# Patient Record
Sex: Female | Born: 1985 | Race: White | Hispanic: No | Marital: Married | Smoking: Never smoker
Health system: Southern US, Community
[De-identification: ages and names within clinical notes are randomized; demographics above are authoritative.]

## PROBLEM LIST (undated history)

## (undated) DIAGNOSIS — N390 Urinary tract infection, site not specified: Secondary | ICD-10-CM

## (undated) DIAGNOSIS — J45909 Unspecified asthma, uncomplicated: Secondary | ICD-10-CM

## (undated) DIAGNOSIS — Z8619 Personal history of other infectious and parasitic diseases: Secondary | ICD-10-CM

## (undated) DIAGNOSIS — L509 Urticaria, unspecified: Secondary | ICD-10-CM

## (undated) DIAGNOSIS — T7840XA Allergy, unspecified, initial encounter: Secondary | ICD-10-CM

## (undated) HISTORY — DX: Allergy, unspecified, initial encounter: T78.40XA

## (undated) HISTORY — DX: Urinary tract infection, site not specified: N39.0

## (undated) HISTORY — DX: Personal history of other infectious and parasitic diseases: Z86.19

## (undated) HISTORY — PX: TONSILLECTOMY: SUR1361

---
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
## 2007-01-17 ENCOUNTER — Emergency Department: Payer: Self-pay | Admitting: Emergency Medicine

## 2009-08-21 ENCOUNTER — Encounter: Admission: RE | Admit: 2009-08-21 | Discharge: 2009-08-21 | Payer: Self-pay | Admitting: Family Medicine

## 2009-08-29 ENCOUNTER — Encounter: Admission: RE | Admit: 2009-08-29 | Discharge: 2009-08-29 | Payer: Self-pay | Admitting: Family Medicine

## 2010-05-21 ENCOUNTER — Encounter: Admission: RE | Admit: 2010-05-21 | Discharge: 2010-05-21 | Payer: Self-pay | Admitting: Pediatrics

## 2010-06-02 ENCOUNTER — Emergency Department (HOSPITAL_COMMUNITY): Admission: EM | Admit: 2010-06-02 | Discharge: 2010-06-02 | Payer: Self-pay | Admitting: Family Medicine

## 2011-03-08 ENCOUNTER — Inpatient Hospital Stay (INDEPENDENT_AMBULATORY_CARE_PROVIDER_SITE_OTHER)
Admission: RE | Admit: 2011-03-08 | Discharge: 2011-03-08 | Disposition: A | Discharge status: Home / Self Care | Payer: BC Managed Care – PPO | Source: Ambulatory Visit | Attending: Family Medicine | Admitting: Family Medicine

## 2011-03-08 DIAGNOSIS — R1013 Epigastric pain: Secondary | ICD-10-CM

## 2011-03-08 LAB — POCT URINALYSIS DIP (DEVICE)
Bilirubin Urine: NEGATIVE
Glucose, UA: NEGATIVE mg/dL
Hgb urine dipstick: NEGATIVE
Ketones, ur: NEGATIVE mg/dL
Specific Gravity, Urine: 1.015 (ref 1.005–1.030)
Urobilinogen, UA: 0.2 mg/dL (ref 0.0–1.0)

## 2011-03-08 LAB — POCT PREGNANCY, URINE: Preg Test, Ur: NEGATIVE

## 2011-03-08 LAB — POCT H PYLORI SCREEN: H. PYLORI SCREEN, POC: NEGATIVE

## 2011-05-05 ENCOUNTER — Ambulatory Visit (HOSPITAL_COMMUNITY)
Admission: RE | Admit: 2011-05-05 | Discharge: 2011-05-05 | Disposition: A | Discharge status: Home / Self Care | Payer: BC Managed Care – PPO | Source: Ambulatory Visit | Attending: Pediatrics | Admitting: Pediatrics

## 2011-05-05 ENCOUNTER — Other Ambulatory Visit (HOSPITAL_COMMUNITY): Payer: Self-pay | Admitting: Pediatrics

## 2011-05-05 DIAGNOSIS — R1032 Left lower quadrant pain: Secondary | ICD-10-CM | POA: Insufficient documentation

## 2013-09-28 DIAGNOSIS — D242 Benign neoplasm of left breast: Secondary | ICD-10-CM | POA: Insufficient documentation

## 2013-09-28 DIAGNOSIS — N946 Dysmenorrhea, unspecified: Secondary | ICD-10-CM | POA: Insufficient documentation

## 2016-09-11 DIAGNOSIS — J324 Chronic pansinusitis: Secondary | ICD-10-CM | POA: Insufficient documentation

## 2018-07-06 ENCOUNTER — Other Ambulatory Visit: Payer: Self-pay

## 2018-07-06 ENCOUNTER — Emergency Department (HOSPITAL_COMMUNITY)
Admission: EM | Admit: 2018-07-06 | Discharge: 2018-07-06 | Disposition: A | Discharge status: Home / Self Care | Payer: 59 | Attending: Emergency Medicine | Admitting: Emergency Medicine

## 2018-07-06 ENCOUNTER — Encounter (HOSPITAL_COMMUNITY): Payer: Self-pay | Admitting: Emergency Medicine

## 2018-07-06 DIAGNOSIS — B349 Viral infection, unspecified: Secondary | ICD-10-CM | POA: Diagnosis not present

## 2018-07-06 DIAGNOSIS — L509 Urticaria, unspecified: Secondary | ICD-10-CM

## 2018-07-06 HISTORY — DX: Urticaria, unspecified: L50.9

## 2018-07-06 LAB — CBC WITH DIFFERENTIAL/PLATELET
Abs Immature Granulocytes: 0.02 10*3/uL (ref 0.00–0.07)
BASOS ABS: 0 10*3/uL (ref 0.0–0.1)
Basophils Relative: 0 %
Eosinophils Absolute: 0 10*3/uL (ref 0.0–0.5)
Eosinophils Relative: 0 %
HEMATOCRIT: 39.1 % (ref 36.0–46.0)
HEMOGLOBIN: 13 g/dL (ref 12.0–15.0)
Immature Granulocytes: 0 %
LYMPHS ABS: 0.7 10*3/uL (ref 0.7–4.0)
LYMPHS PCT: 9 %
MCH: 30.6 pg (ref 26.0–34.0)
MCHC: 33.2 g/dL (ref 30.0–36.0)
MCV: 92 fL (ref 80.0–100.0)
Monocytes Absolute: 0.6 10*3/uL (ref 0.1–1.0)
Monocytes Relative: 8 %
NEUTROS PCT: 83 %
NRBC: 0 % (ref 0.0–0.2)
Neutro Abs: 6 10*3/uL (ref 1.7–7.7)
Platelets: 227 10*3/uL (ref 150–400)
RBC: 4.25 MIL/uL (ref 3.87–5.11)
RDW: 11.9 % (ref 11.5–15.5)
WBC: 7.4 10*3/uL (ref 4.0–10.5)

## 2018-07-06 LAB — I-STAT CHEM 8, ED
BUN: 5 mg/dL — ABNORMAL LOW (ref 6–20)
Calcium, Ion: 1.26 mmol/L (ref 1.15–1.40)
Chloride: 104 mmol/L (ref 98–111)
Creatinine, Ser: 0.5 mg/dL (ref 0.44–1.00)
Glucose, Bld: 94 mg/dL (ref 70–99)
HCT: 38 % (ref 36.0–46.0)
HEMOGLOBIN: 12.9 g/dL (ref 12.0–15.0)
Potassium: 4 mmol/L (ref 3.5–5.1)
SODIUM: 140 mmol/L (ref 135–145)
TCO2: 26 mmol/L (ref 22–32)

## 2018-07-06 LAB — BASIC METABOLIC PANEL
Anion gap: 7 (ref 5–15)
BUN: 7 mg/dL (ref 6–20)
CALCIUM: 8.9 mg/dL (ref 8.9–10.3)
CO2: 25 mmol/L (ref 22–32)
CREATININE: 0.48 mg/dL (ref 0.44–1.00)
Chloride: 107 mmol/L (ref 98–111)
Glucose, Bld: 98 mg/dL (ref 70–99)
Potassium: 4.4 mmol/L (ref 3.5–5.1)
SODIUM: 139 mmol/L (ref 135–145)

## 2018-07-06 LAB — I-STAT BETA HCG BLOOD, ED (MC, WL, AP ONLY): I-stat hCG, quantitative: <5 m[IU]/mL (ref <5)

## 2018-07-06 MED ORDER — FAMOTIDINE IN NACL 20-0.9 MG/50ML-% IV SOLN
20.0000 mg | Freq: Once | INTRAVENOUS | Status: DC
  Filled 2018-07-06: qty 50

## 2018-07-06 MED ORDER — ACETAMINOPHEN 325 MG PO TABS
650.0000 mg | ORAL_TABLET | Freq: Once | ORAL | Status: AC
  Administered 2018-07-06: 650 mg via ORAL
  Filled 2018-07-06: qty 2

## 2018-07-06 MED ORDER — PREDNISONE 50 MG PO TABS
60.0000 mg | ORAL_TABLET | Freq: Once | ORAL | Status: AC
  Administered 2018-07-06: 60 mg via ORAL
  Filled 2018-07-06: qty 1

## 2018-07-06 MED ORDER — DIPHENHYDRAMINE HCL 25 MG PO CAPS
25.0000 mg | ORAL_CAPSULE | Freq: Once | ORAL | Status: AC
Start: 2018-07-06 — End: 2018-07-06
  Administered 2018-07-06: 25 mg via ORAL
  Filled 2018-07-06: qty 1

## 2018-07-06 MED ORDER — DEXAMETHASONE SODIUM PHOSPHATE 10 MG/ML IJ SOLN
10.0000 mg | Freq: Once | INTRAMUSCULAR | Status: DC
  Administered 2018-07-06: 10 mg via INTRAVENOUS
  Filled 2018-07-06: qty 1

## 2018-07-06 MED ORDER — PREDNISONE 20 MG PO TABS: 40.0000 mg | ORAL_TABLET | Freq: Every day | ORAL | 0 refills | Status: AC

## 2018-07-06 MED ORDER — FAMOTIDINE IN NACL 20-0.9 MG/50ML-% IV SOLN
20.0000 mg | Freq: Once | INTRAVENOUS | Status: AC
  Administered 2018-07-06: 20 mg via INTRAVENOUS

## 2018-07-06 NOTE — ED Triage Notes (Signed)
Pt c/o "flu like" symptoms including fever, bodyaches, HA, chest pain, and nausea x3 days. Pt states during those three days, she has also developed hives all over body. Benadryl taken at midnight last night with no relief. States she was swabbed for flu on Monday and it was negative. Reports new body soap.

## 2018-07-06 NOTE — ED Notes (Signed)
Patient given discharge instruction, verbalized understand. IV removed, band aid applied. Patient ambulatory out of the department.  

## 2018-07-06 NOTE — ED Provider Notes (Signed)
[facility] EMERGENCY DEPARTMENT Provider Note   CSN: [id number] Arrival date & time: 07/06/18  4270   History   Chief Complaint Chief Complaint  Patient presents with  . Urticaria    HPI Audrey Reyes is a 32 y.o. female with a past medical history significant for urticaria how presents for evaluation of urticaria. Patient states this began on Sunday. Has been taking Benadryl intermittently for her symptoms.  States her areas of urticaria are painful when she scratches them.  Describes it as a burning sensation.  Admits to pruritis. Denies chills, headache, nausea, vomiting, abdominal pain, SOB, chest pain, diarrhea. States she has a history of urticaria with target like lesions. Her episodes last 1-2 weeks. Has been seen by an allergist in the past for her urticaria. Was told that see "was not allergic to anything and hives would go away on their own." Admits to subjective fever, body aches and malaise. Was seen at [facility] on Sunday with a negative influenza test.  Denies peeling skin, blistering, warmth to the area, feeling of sensation of throat or tongue swelling, oropharyngeal lesions..  Denies sick contacts. Denies hx Hep C.  History obtained from patient.  No interpreter was used.  HPI  Past Medical History:  Diagnosis Date  . Urticaria     There are no active problems to display for this patient.   Past Surgical History:  Procedure Laterality Date  . TONSILLECTOMY       OB History   None      Home Medications    Prior to Admission medications   Medication Sig Start Date End Date Taking? Authorizing Provider  predniSONE (DELTASONE) 20 MG tablet Take 2 tablets (40 mg total) by mouth daily for 5 days. 07/06/18 07/11/18  Magdelena Kinsella A, PA-C    Family History No family history on file.  Social History Social History   Tobacco Use  . Smoking status: Never Smoker  . Smokeless tobacco: Never Used  Substance Use Topics  . Alcohol use: Yes    Comment: 1  glass wine/beer per day  . Drug use: Not Currently     Allergies   Sulfa antibiotics   Review of Systems Review of Systems  Constitutional: Positive for chills. Negative for activity change and appetite change.       Subjective fever.  HENT: Negative for congestion, drooling, ear discharge, ear pain, facial swelling, mouth sores, postnasal drip, rhinorrhea, sinus pressure, sinus pain, sneezing, sore throat, tinnitus, trouble swallowing and voice change.   Eyes: Negative for pain, discharge, redness, itching and visual disturbance.  Respiratory: Negative for choking, chest tightness, shortness of breath, wheezing and stridor.        Intermittent nonproductive cough.  Cardiovascular: Negative.   Gastrointestinal: Negative.   Genitourinary: Negative.   Musculoskeletal: Positive for arthralgias and myalgias. Negative for gait problem, neck pain and neck stiffness.  Skin: Positive for rash.  All other systems reviewed and are negative.    Physical Exam Updated Vital Signs BP 124/77   Pulse (!) 101   Temp 100.1 F (37.8 C) (Oral)   Resp 17   Ht 5\' 8"  (1.727 m)   Wt 56.7 kg   LMP 06/05/2018 (Approximate)   SpO2 99%   BMI 19.01 kg/m   Physical Exam  Constitutional: She appears well-developed and well-nourished. No distress.  HENT:  Head: Normocephalic and atraumatic.  Right Ear: Tympanic membrane, external ear and ear canal normal.  Left Ear: Tympanic membrane, external ear and ear canal  normal.  Nose: Nose normal. Right sinus exhibits no maxillary sinus tenderness and no frontal sinus tenderness. Left sinus exhibits no maxillary sinus tenderness and no frontal sinus tenderness.  Mouth/Throat: Uvula is midline, oropharynx is clear and moist and mucous membranes are normal. No oral lesions. No trismus in the jaw. No dental abscesses, uvula swelling or lacerations. No oropharyngeal exudate, posterior oropharyngeal edema, posterior oropharyngeal erythema or tonsillar abscesses. No  tonsillar exudate.  Oropharynx moist.  No evidence of lesions.  No edema or erythema of the oropharynx.  No tongue swelling.  No elevation of palate.  Eyes: Pupils are equal, round, and reactive to light.  Neck: Normal range of motion, full passive range of motion without pain and phonation normal. Neck supple. No neck rigidity. No edema, no erythema and normal range of motion present.  Cardiovascular: Normal rate, regular rhythm, normal heart sounds, intact distal pulses and normal pulses.  Pulmonary/Chest: Effort normal and breath sounds normal. No accessory muscle usage or stridor. No tachypnea. No respiratory distress. She has no decreased breath sounds. She has no wheezes. She has no rhonchi. She has no rales.  Abdominal: Soft. Normal appearance and bowel sounds are normal. She exhibits no distension and no fluid wave. There is no tenderness. There is no rigidity, no rebound and no guarding.  Musculoskeletal: Normal range of motion.  5/5 strength bilateral upper and lower extremities.  Full range of motion to extremities.  To ambulate without difficulty.  Neurological: She is alert.  Skin: Skin is warm and dry. Rash noted. Rash is urticarial. She is not diaphoretic.  Multiple areas of scattered erythematous lesions.  Rash is urticarial and target lesion in nature.  Lesions are not vesicular in nature.  No areas of desquamated skin or blistering.  Rash is primarily located to bilateral lower extremities and groin, however there are some lesions located to the posterior back, 2 lesions on the forehead and 3 on the abdomen.  No drainage from lesions.  There is no warmth from these lesions.  Psychiatric: She has a normal mood and affect.  Nursing note and vitals reviewed.    ED Treatments / Results  Labs (all labs ordered are listed, but only abnormal results are displayed) Labs Reviewed  I-STAT CHEM 8, ED - Abnormal; Notable for the following components:      Result Value   BUN 5 (*)    All  other components within normal limits  CBC WITH DIFFERENTIAL/PLATELET  BASIC METABOLIC PANEL  I-STAT BETA HCG BLOOD, ED ([facility], [facility], [facility] ONLY)    EKG EKG Interpretation  Date/Time:  Wednesday July 06 2018 07:57:15 EST Ventricular Rate:  102 PR Interval:    QRS Duration: 97 QT Interval:  318 QTC Calculation: 415 R Axis:   0 Text Interpretation:  Sinus tachycardia no previous Confirmed by Fredia Sorrow [phone]) on 07/06/2018 7:59:58 AM   Radiology No results found.  Procedures Procedures (including critical care time)  Medications Ordered in ED Medications  diphenhydrAMINE (BENADRYL) capsule 25 mg (25 mg Oral Given 07/06/18 0938)  predniSONE (DELTASONE) tablet 60 mg (60 mg Oral Given 07/06/18 0952)  acetaminophen (TYLENOL) tablet 650 mg (650 mg Oral Given 07/06/18 0952)  famotidine (PEPCID) IVPB 20 mg premix (0 mg Intravenous Stopped 07/06/18 1032)     Initial Impression / Assessment and Plan / ED Course  I have reviewed the triage vital signs and the nursing notes.  Pertinent labs & imaging results that were available during my care of the patient were reviewed  by me and considered in my medical decision making (see chart for details).  32 year old female who appears otherwise well presents for evaluation of urticaria as well as myalgias.  Temperature 100.1, nonseptic, non-ill-appearing.   Mildy tachycardic at 101.  Seen at urgent care approximately 3 days ago for similar symptoms.  Negative influenza test at that time.  Received flu shot this year.  History of intermittent urticaria and target lesions of unknown cause.  Has seen by allergist outpatient with no official cause. Patient with diffuse erythematous urticarial rash some with target lesions, worse on bilateral lower extremities, groin and back.  No evidence of mucous membrane involvement.  No sensation of oropharyngeal swelling or shortness of breath.  No evidence of desquamated skin or blisters.  Admits to history of new  soap approximately 2 weeks ago.  Able to speak in full sentences without difficulty.  Able to tolerate oral secretions.  Metabolic panel without any electrolyte, renal abnormalities.  CBC without evidence of leukocytosis, hCG negative.  EKG sinus tach, no other EKGs to compare.  Patient has had similar urticarial, target lesion rash in the past.  Has been worked up extensively by allergist, without known cause.  Patient states rash is similar to her previous outbreaks. She states she feels improved with medications in the department.  Requesting DC home at this time.  Patient has no evidence of anaphylaxis while in the department.  She is hemodynamically stable.  Discussed with patient outpatient regimen of her urticaria.  Strict return precautions given.  Discussed with patient follow-up with PCP for reevaluation.  Patient voiced understanding and is agreeable for follow-up.    Final Clinical Impressions(s) / ED Diagnoses   Final diagnoses:  Viral illness  Urticaria    ED Discharge Orders         Ordered    predniSONE (DELTASONE) 20 MG tablet  Daily     07/06/18 1052           Farhad Burleson A, PA-C 07/06/18 1108    Fredia Sorrow, MD 07/06/18 1639

## 2018-07-06 NOTE — Discharge Instructions (Addendum)
You have been evaluated today for viral illness and hives. Please take Benadryl 25 mg every 8 hours for the next 3 days. You may also take Zyrtec as well. I have prescribed you Prednisone at 40 mg.  Please take this over the next 5 days.  Please follow-up with your PCP for reevalaution this week.  Return to the ED with any new or worsening symptoms such as:  Get help right away if: You have a fever. You have belly pain. Your tongue or lips are swollen. Your eyelids are swollen. Your chest or throat feels tight. You have trouble breathing or swallowing.

## 2019-03-21 ENCOUNTER — Other Ambulatory Visit: Payer: Self-pay

## 2019-03-21 DIAGNOSIS — Z20822 Contact with and (suspected) exposure to covid-19: Secondary | ICD-10-CM

## 2019-03-24 LAB — NOVEL CORONAVIRUS, NAA: SARS-CoV-2, NAA: NOT DETECTED

## 2019-04-12 ENCOUNTER — Ambulatory Visit: Payer: 59 | Admitting: Family Medicine

## 2019-04-14 ENCOUNTER — Ambulatory Visit: Payer: 59 | Admitting: Family Medicine

## 2019-04-27 ENCOUNTER — Ambulatory Visit: Payer: 59 | Admitting: Family Medicine

## 2019-05-03 ENCOUNTER — Encounter: Payer: Self-pay | Admitting: Family Medicine

## 2019-05-03 ENCOUNTER — Encounter: Payer: Self-pay | Admitting: Gastroenterology

## 2019-05-03 ENCOUNTER — Ambulatory Visit: Payer: 59 | Admitting: Family Medicine

## 2019-05-03 ENCOUNTER — Other Ambulatory Visit: Payer: Self-pay

## 2019-05-03 VITALS — BP 122/64 | HR 67 | Temp 98.8°F | Ht 68.0 in | Wt 132.2 lb

## 2019-05-03 DIAGNOSIS — L509 Urticaria, unspecified: Secondary | ICD-10-CM | POA: Insufficient documentation

## 2019-05-03 DIAGNOSIS — R1011 Right upper quadrant pain: Secondary | ICD-10-CM

## 2019-05-03 DIAGNOSIS — R1013 Epigastric pain: Secondary | ICD-10-CM | POA: Diagnosis not present

## 2019-05-03 DIAGNOSIS — R0982 Postnasal drip: Secondary | ICD-10-CM | POA: Diagnosis not present

## 2019-05-03 LAB — CBC WITH DIFFERENTIAL/PLATELET
Basophils Absolute: 0 10*3/uL (ref 0.0–0.1)
Basophils Relative: 0.8 % (ref 0.0–3.0)
Eosinophils Absolute: 0.1 10*3/uL (ref 0.0–0.7)
Eosinophils Relative: 1.9 % (ref 0.0–5.0)
HCT: 42.5 % (ref 36.0–46.0)
Hemoglobin: 14.5 g/dL (ref 12.0–15.0)
Lymphocytes Relative: 29.4 % (ref 12.0–46.0)
Lymphs Abs: 1.4 10*3/uL (ref 0.7–4.0)
MCHC: 34.2 g/dL (ref 30.0–36.0)
MCV: 93.2 fl (ref 78.0–100.0)
Monocytes Absolute: 0.5 10*3/uL (ref 0.1–1.0)
Monocytes Relative: 10.4 % (ref 3.0–12.0)
Neutro Abs: 2.8 10*3/uL (ref 1.4–7.7)
Neutrophils Relative %: 57.5 % (ref 43.0–77.0)
Platelets: 237 10*3/uL (ref 150.0–400.0)
RBC: 4.56 Mil/uL (ref 3.87–5.11)
RDW: 12.8 % (ref 11.5–15.5)
WBC: 4.8 10*3/uL (ref 4.0–10.5)

## 2019-05-03 LAB — COMPREHENSIVE METABOLIC PANEL
ALT: 11 U/L (ref 0–35)
AST: 14 U/L (ref 0–37)
Albumin: 4.8 g/dL (ref 3.5–5.2)
Alkaline Phosphatase: 50 U/L (ref 39–117)
BUN: 7 mg/dL (ref 6–23)
CO2: 27 mEq/L (ref 19–32)
Calcium: 9.8 mg/dL (ref 8.4–10.5)
Chloride: 105 mEq/L (ref 96–112)
Creatinine, Ser: 0.61 mg/dL (ref 0.40–1.20)
GFR: 113.64 mL/min (ref >60.00)
Glucose, Bld: 82 mg/dL (ref 70–99)
Potassium: 4.6 mEq/L (ref 3.5–5.1)
Sodium: 138 mEq/L (ref 135–145)
Total Bilirubin: 0.7 mg/dL (ref 0.2–1.2)
Total Protein: 7.5 g/dL (ref 6.0–8.3)

## 2019-05-03 LAB — LIPASE: Lipase: 13 U/L (ref 11.0–59.0)

## 2019-05-03 LAB — TSH: TSH: 1.67 u[IU]/mL (ref 0.35–4.50)

## 2019-05-03 MED ORDER — PANTOPRAZOLE SODIUM 40 MG PO TBEC: 40.0000 mg | DELAYED_RELEASE_TABLET | Freq: Every day | ORAL | 0 refills | Status: DC

## 2019-05-03 MED ORDER — FLUTICASONE PROPIONATE 50 MCG/ACT NA SUSP: 2.0000 | Freq: Every day | NASAL | 6 refills | Status: DC

## 2019-05-03 NOTE — Patient Instructions (Signed)
Sending in protonix for you to start in the AM for gastritis/reflux. Take for one month. F/u with GI.   For RUQ pain, sounds and clinically seems like gallbladder. Checking more labs, but since ultrasound was normal other thought would be dysfunction of the sphincter of oddi. GI will f/u on this as well.   flonase nightly for post nasal drip.   Gastritis, Adult  Gastritis is swelling (inflammation) of the stomach. Gastritis can develop quickly (acute). It can also develop slowly over time (chronic). It is important to get help for this condition. If you do not get help, your stomach can bleed, and you can get sores (ulcers) in your stomach. What are the causes? This condition may be caused by:  Germs that get to your stomach.  Drinking too much alcohol.  Medicines you are taking.  Too much acid in the stomach.  A disease of the intestines or stomach.  Stress.  An allergic reaction.  Crohn's disease.  Some cancer treatments (radiation). Sometimes the cause of this condition is not known. What are the signs or symptoms? Symptoms of this condition include:  Pain in your stomach.  A burning feeling in your stomach.  Feeling sick to your stomach (nauseous).  Throwing up (vomiting).  Feeling too full after you eat.  Weight loss.  Bad breath.  Throwing up blood.  Blood in your poop (stool). How is this diagnosed? This condition may be diagnosed with:  Your medical history and symptoms.  A physical exam.  Tests. These can include: ? Blood tests. ? Stool tests. ? A procedure to look inside your stomach (upper endoscopy). ? A test in which a sample of tissue is taken for testing (biopsy). How is this treated? Treatment for this condition depends on what caused it. You may be given:  Antibiotic medicine, if your condition was caused by germs.  H2 blockers and similar medicines, if your condition was caused by too much acid. Follow these instructions at home:  Medicines  Take over-the-counter and prescription medicines only as told by your doctor.  If you were prescribed an antibiotic medicine, take it as told by your doctor. Do not stop taking it even if you start to feel better. Eating and drinking   Eat small meals often, instead of large meals.  Avoid foods and drinks that make your symptoms worse.  Drink enough fluid to keep your pee (urine) pale yellow. Alcohol use  Do not drink alcohol if: ? Your doctor tells you not to drink. ? You are pregnant, may be pregnant, or are planning to become pregnant.  If you drink alcohol: ? Limit your use to:  0-1 drink a day for women.  0-2 drinks a day for men. ? Be aware of how much alcohol is in your drink. In the [place] one drink equals one 12 oz bottle of beer (355 mL), one 5 oz glass of wine (148 mL), or one 1 oz glass of hard liquor (44 mL). General instructions  Talk with your doctor about ways to manage stress. You can exercise or do deep breathing, meditation, or yoga.  Do not smoke or use products that have nicotine or tobacco. If you need help quitting, ask your doctor.  Keep all follow-up visits as told by your doctor. This is important. Contact a doctor if:  Your symptoms get worse.  Your symptoms go away and then come back. Get help right away if:  You throw up blood or something that looks like coffee  grounds.  You have black or dark red poop.  You throw up any time you try to drink fluids.  Your stomach pain gets worse.  You have a fever.  You do not feel better after one week. Summary  Gastritis is swelling (inflammation) of the stomach.  You must get help for this condition. If you do not get help, your stomach can bleed, and you can get sores (ulcers).  This condition is diagnosed with medical history, physical exam, or tests.  You can be treated with medicines for germs or medicines to block too much acid in your stomach. This information is not  intended to replace advice given to you by your health care provider. Make sure you discuss any questions you have with your health care provider. Document Released: 02/03/2008 Document Revised: 01/04/2018 Document Reviewed: 01/04/2018 Elsevier Patient Education  2020 [employer].

## 2019-05-03 NOTE — Progress Notes (Signed)
Patient: Audrey Reyes MRN: [id number] DOB: 1986-08-10 PCP: Orma Flaming, MD     Subjective:  Chief Complaint  Patient presents with  . Establish Care  . stomach issues    HPI: The patient is a 33 y.o. female who presents today for establishing care and stomach issues. She states she started to have symptoms about 2 months ago. She thought it was stomach ulcer and started omeprazole and tagamet. She then thought it was GERD. She went and saw her normal doctor who tested for h.pylori and this was negative. She checked urine, abdominal ultrasound and this was normal. They were trying to refer to GI doc and she hasn't heard anything. No medication was prescribed by her pcp.   Initially she had burning in her throat and chest. Someday she would wake up with throat congestion and someday tightening in her chest area. Now she is having what feels like a balloon that was blown up and put in her right rib cage. It is worse when she is sitting, driving. She feels nerve sensitivity in this area as well. Food does not seem to exacerbate her symptoms. She thinks caffeine may make it worse. Laying flat makes it feel better. Twisting makes it better. Diet is healthy. She does spicey and no greasy/fatty. She is pescaterian.  Husband has adjusted her (he is a Restaurant manager, fast food) and it helps, but does not alleviate the balloon feeling. Not painful, more discomfort. She rates it as a 4-5/10 and is constant unless lying down. Pain is dull. At times it will radiate to the back, but mainly underneath the rib cage. She drinks 1.5 glasses of wine/night. No NSAIDs. She is more stressed than normal. She does not eat a lot of chocolate. No nausea/vomting, diarrhea, black stool.   Review of Systems  Constitutional: Negative for chills, fatigue, fever and unexpected weight change.  HENT: Negative for postnasal drip, rhinorrhea and sore throat.   Eyes: Negative for visual disturbance.  Respiratory: Negative for cough,  chest tightness and shortness of breath.   Cardiovascular: Negative for chest pain, palpitations and leg swelling.  Gastrointestinal: Positive for abdominal pain (Ruq pain). Negative for blood in stool, diarrhea, nausea and vomiting.  Endocrine: Negative for polydipsia and polyuria.  Genitourinary: Negative for dysuria and frequency.  Musculoskeletal: Negative for arthralgias, back pain, myalgias and neck pain.  Skin: Negative for rash.  Neurological: Negative for dizziness and headaches.  Psychiatric/Behavioral: Negative for sleep disturbance.    Allergies Patient is allergic to sulfa antibiotics.  Past Medical History Patient  has a past medical history of Urticaria.  Surgical History Patient  has a past surgical history that includes Tonsillectomy.  Family History Pateint's family history is not on file.  Social History Patient  reports that she has never smoked. She has never used smokeless tobacco. She reports current alcohol use. She reports previous drug use.    Objective: Vitals:   05/03/19 0841  BP: 122/64  Pulse: 67  Temp: 98.8 F (37.1 C)  TempSrc: Skin  SpO2: 99%  Weight: 132 lb 3.2 oz (60 kg)  Height: 5\' 8"  (1.727 m)    Body mass index is 20.1 kg/m.  Physical Exam Vitals signs reviewed.  Constitutional:      Appearance: Normal appearance. She is normal weight.  HENT:     Head: Normocephalic and atraumatic.     Right Ear: Tympanic membrane, ear canal and external ear normal.     Left Ear: Tympanic membrane, ear canal and external ear  normal.     Nose: Nose normal.     Mouth/Throat:     Mouth: Mucous membranes are moist.     Comments: Cobblestoning on posterior pharynx  Eyes:     Extraocular Movements: Extraocular movements intact.     Conjunctiva/sclera: Conjunctivae normal.     Pupils: Pupils are equal, round, and reactive to light.  Neck:     Musculoskeletal: Normal range of motion and neck supple.  Cardiovascular:     Rate and Rhythm: Normal  rate and regular rhythm.     Heart sounds: Normal heart sounds.  Pulmonary:     Effort: Pulmonary effort is normal.  Abdominal:     General: Abdomen is flat. Bowel sounds are normal.     Palpations: Abdomen is soft.     Tenderness: There is abdominal tenderness (RUQ. mildly positive murpy's sign TTP in epigastric area ). There is no guarding or rebound.  Lymphadenopathy:     Cervical: No cervical adenopathy.  Skin:    Findings: No rash.  Neurological:     General: No focal deficit present.     Mental Status: She is alert and oriented to person, place, and time.     Cranial Nerves: No cranial nerve deficit.     Motor: No weakness.        Depression screen PHQ 2/9 05/03/2019  Decreased Interest 0  Down, Depressed, Hopeless 0  PHQ - 2 Score 0    Assessment/plan: 1. RUQ pain Requesting records from previous pcp. States she had a normal abdominal Ultrasound. Still having pain and clinical hx/exam that seems consistent with gallbladder. ? If sphincter or oddi dysfunction. Needs HIDA, but will let GI determine and order since I do not order these often. Checking labs, h.pylori already done. Referral to GI. Continue healthy diet. Discussed foods to avoid.  - Ambulatory referral to Gastroenterology - CBC with Differential/Platelet - Comprehensive metabolic panel - Lipase - TSH  2. Epigastric pain ? If gastritis. Tender in this area. Stop wine/chocolate; otherwise diet healthy. Starting her on protonix daily x 30 days and can f/u with GI to determine if needs EGD. Denies any dark/tarry stools. ER precautions given.   3. Post-nasal drip Trial of flonase in PM.     Return if symptoms worsen or fail to improve, for annual .   Orma Flaming, MD [place]   05/03/2019

## 2019-05-12 ENCOUNTER — Encounter: Payer: Self-pay | Admitting: Gastroenterology

## 2019-05-12 ENCOUNTER — Ambulatory Visit: Payer: 59 | Admitting: Gastroenterology

## 2019-05-12 VITALS — BP 100/64 | HR 88 | Temp 98.8°F | Ht 68.0 in | Wt 132.0 lb

## 2019-05-12 DIAGNOSIS — R12 Heartburn: Secondary | ICD-10-CM | POA: Diagnosis not present

## 2019-05-12 DIAGNOSIS — R1011 Right upper quadrant pain: Secondary | ICD-10-CM

## 2019-05-12 MED ORDER — HYOSCYAMINE SULFATE SL 0.125 MG SL SUBL: 1.0000 | SUBLINGUAL_TABLET | Freq: Four times a day (QID) | SUBLINGUAL | 1 refills | Status: DC | PRN

## 2019-05-12 NOTE — Progress Notes (Signed)
05/12/2019 Audrey Reyes [id number] 1985/10/30   HISTORY OF PRESENT ILLNESS: This is a very pleasant 33 year old female who is new to our office.  She was referred here by her PCP, Dr. Orma Flaming, for evaluation regarding right upper quadrant abdominal pain.  She tells me that this all started about 2 months ago.  In July she started having what she thought was heartburn and epigastric discomfort maybe related to reflux, although she had never had any issues with that in the past.  She took Tagamet and Pepcid with no relief of symptoms.  Then the pain started to move to the right upper quadrant, right under her right rib cage.  She says at this point it has become a constant discomfort and she does have pain directly through to her back on the right side.  She said it has worsened over the last couple of weeks and particularly even more so over the last couple of days.  Constantly working to get comfortable when in a sitting position.  Does not seem to be affected by eating or drinking.  She had an ultrasound that was normal.  H. pylori testing is negative per her report although I do not see those results.  She is now on Protonix 40 mg daily, which she has been taking for about a week with so far no improvement.  TSH, lipase, CBC, CMP completely normal.    Past Medical History:  Diagnosis Date  . Allergies   . History of chicken pox   . Urticaria   . UTI (urinary tract infection)    Past Surgical History:  Procedure Laterality Date  . TONSILLECTOMY      reports that she has never smoked. She has never used smokeless tobacco. She reports current alcohol use. She reports previous drug use. family history includes Alcohol abuse in her maternal grandfather and paternal grandfather; Cancer in her maternal grandfather; Colon polyps in her father; Diabetes in her father, paternal grandfather, and paternal grandmother; Hearing loss in her maternal grandfather; Heart attack in her  paternal grandfather and paternal grandmother; Hyperlipidemia in her father, paternal grandfather, and paternal grandmother; Hypertension in her father, paternal grandfather, and paternal grandmother; Other in her mother; Stroke in her maternal grandfather, paternal grandfather, and paternal grandmother. Allergies  Allergen Reactions  . Sulfa Antibiotics Hives and Rash      Outpatient Encounter Medications as of 05/12/2019  Medication Sig  . albuterol (VENTOLIN HFA) 108 (90 Base) MCG/ACT inhaler Ventolin HFA 90 mcg/actuation aerosol inhaler  . cetirizine (ZYRTEC) 10 MG tablet Take 10 mg by mouth 2 (two) times daily.  . fluticasone (FLONASE) 50 MCG/ACT nasal spray Place 2 sprays into both nostrils daily.  . pantoprazole (PROTONIX) 40 MG tablet Take 1 tablet (40 mg total) by mouth daily.   No facility-administered encounter medications on file as of 05/12/2019.      REVIEW OF SYSTEMS  : All other systems reviewed and negative except where noted in the History of Present Illness.   PHYSICAL EXAM: BP 100/64   Pulse 88   Temp 98.8 F (37.1 C)   Ht 5\' 8"  (1.727 m)   Wt 132 lb (59.9 kg)   LMP 04/17/2019 (Exact Date)   BMI 20.07 kg/m  General: Well developed white female in no acute distress Head: Normocephalic and atraumatic Eyes:  Sclerae anicteric, conjunctiva pink. Ears: Normal auditory acuity Lungs: Clear throughout to auscultation; no increased WOB. Heart: Regular rate and rhythm; no M/R/G. Abdomen: Soft,  non-distended.  BS present.  Epigastric and RUQ TTP. Musculoskeletal: Symmetrical with no gross deformities  Skin: No lesions on visible extremities Extremities: No edema  Neurological: Alert oriented x 4, grossly non-focal Psychological:  Alert and cooperative. Normal mood and affect  ASSESSMENT AND PLAN: *RUQ abdominal pain: Also having some pyrosis/heartburn type symptoms.  She had a normal ultrasound.  Pain is constant, which does not necessarily fit the profile for  dysfunctional gallbladder, but I think makes more sense than anything else.  She never had any heartburn or reflux symptoms in the past.  Has no risk factors for an ulcer and was H. pylori negative.  After discussion with the patient regarding our options for testing we decided to proceed with HIDA scan with CCK.  We will continue pantoprazole 40 mg daily for now since that was just started 1 week ago.  Advised that she could use a heating pad on the right upper quadrant and will also give a smooth muscle relaxant in the form of Levsin 0.125 mg to use every 6 hours as needed.  Prescription sent.   CC:  Orma Flaming, MD

## 2019-05-12 NOTE — Patient Instructions (Addendum)
You have been scheduled for a HIDA scan at [facility] (1st floor) on Wednesday, 05/17/19. Please arrive 15 minutes prior to your scheduled appointment at  [id number] am. Make certain not to have anything to eat or drink at least 6 hours prior to your test. Should this appointment date or time not work well for you, please call radiology scheduling at [phone].  _____________________________________________________________ hepatobiliary (HIDA) scan is an imaging procedure used to diagnose problems in the liver, gallbladder and bile ducts. In the HIDA scan, a radioactive chemical or tracer is injected into a vein in your arm. The tracer is handled by the liver like bile. Bile is a fluid produced and excreted by your liver that helps your digestive system break down fats in the foods you eat. Bile is stored in your gallbladder and the gallbladder releases the bile when you eat a meal. A special nuclear medicine scanner (gamma camera) tracks the flow of the tracer from your liver into your gallbladder and small intestine.  During your HIDA scan  You'll be asked to change into a hospital gown before your HIDA scan begins. Your health care team will position you on a table, usually on your back. The radioactive tracer is then injected into a vein in your arm.The tracer travels through your bloodstream to your liver, where it's taken up by the bile-producing cells. The radioactive tracer travels with the bile from your liver into your gallbladder and through your bile ducts to your small intestine.You may feel some pressure while the radioactive tracer is injected into your vein. As you lie on the table, a special gamma camera is positioned over your abdomen taking pictures of the tracer as it moves through your body. The gamma camera takes pictures continually for about an hour. You'll need to keep still during the HIDA scan. This can become uncomfortable, but you may find that you can lessen the discomfort by  taking deep breaths and thinking about other things. Tell your health care team if you're uncomfortable. The radiologist will watch on a computer the progress of the radioactive tracer through your body. The HIDA scan may be stopped when the radioactive tracer is seen in the gallbladder and enters your small intestine. This typically takes about an hour. In some cases extra imaging will be performed if original images aren't satisfactory, if morphine is given to help visualize the gallbladder or if the medication CCK is given to look at the contraction of the gallbladder. This test typically takes 2 hours to complete. _____________________________________________________________  Continue Pantoprazole daily.  We have sent the following medications to your pharmacy for you to pick up at your convenience: Levsin 0.125 mg under the tongue as needed  If you are age 32 or older, your body mass index should be between 23-30. Your Body mass index is 20.07 kg/m. If this is out of the aforementioned range listed, please consider follow up with your Primary Care Provider.  If you are age 49 or younger, your body mass index should be between 19-25. Your Body mass index is 20.07 kg/m. If this is out of the aformentioned range listed, please consider follow up with your Primary Care Provider.

## 2019-05-12 NOTE — Progress Notes (Signed)
I agree with the above note, plan 

## 2019-05-17 ENCOUNTER — Encounter (HOSPITAL_COMMUNITY): Payer: Self-pay

## 2019-05-17 ENCOUNTER — Other Ambulatory Visit: Payer: Self-pay

## 2019-05-17 ENCOUNTER — Encounter (HOSPITAL_COMMUNITY)
Admission: RE | Admit: 2019-05-17 | Discharge: 2019-05-17 | Disposition: A | Discharge status: Home / Self Care | Payer: 59 | Source: Ambulatory Visit | Attending: Gastroenterology | Admitting: Gastroenterology

## 2019-05-17 DIAGNOSIS — R1011 Right upper quadrant pain: Secondary | ICD-10-CM | POA: Diagnosis present

## 2019-05-17 DIAGNOSIS — R12 Heartburn: Secondary | ICD-10-CM | POA: Insufficient documentation

## 2019-05-17 HISTORY — DX: Unspecified asthma, uncomplicated: J45.909

## 2019-05-17 MED ORDER — SODIUM CHLORIDE FLUSH 0.9 % IV SOLN
INTRAVENOUS | Status: AC
  Filled 2019-05-17: qty 80

## 2019-05-17 MED ORDER — STERILE WATER FOR INJECTION IJ SOLN
INTRAMUSCULAR | Status: AC
  Administered 2019-05-17: 1.2 mL via INTRAVENOUS
  Filled 2019-05-17: qty 10

## 2019-05-17 MED ORDER — SINCALIDE 5 MCG IJ SOLR
INTRAMUSCULAR | Status: AC
  Administered 2019-05-17: 1.2 ug via INTRAVENOUS
  Filled 2019-05-17: qty 5

## 2019-05-17 MED ORDER — TECHNETIUM TC 99M MEBROFENIN IV KIT
5.0000 | PACK | Freq: Once | INTRAVENOUS | Status: AC | PRN
  Administered 2019-05-17: 4.5 via INTRAVENOUS

## 2019-05-22 ENCOUNTER — Telehealth: Payer: Self-pay

## 2019-05-22 NOTE — Telephone Encounter (Signed)
Pre visit and EGD scheduled for 10/2 at 130 pm and 10/9 10 am.  Pt sent information via My Chart

## 2019-05-22 NOTE — Telephone Encounter (Signed)
From:Jazsmin M Myrtie Soman      Sent:05/19/2019  5:10 PM EDT        [id number] Shafiq Larch P   Subject:RE:Results  That sounds good. I wish this wasn't a consideration but do you have any way of being able to tell what it will cost with my insurance? Or with any of the other tests we were considering (CT scan, etc.)?   Regardless, let's proceed with getting that scheduled!   Thank you, Myana   ----- Message -----      From:Nurse Alejos Reinhardt P      Sent:05/19/2019  1:46 PM EDT        [id number] M Myrtie Soman   Subject:RE:Results  I am glad that the antispasmodic is helping.  Those are mostly prescribed for intestinal spasm/cramping.  I think that with the reflux symptoms as well, which are also new and unusual for you,  maybe we should proceed with EGD next to rule out duodenal ulcer, etc.  Let me know what you think.     ----- Message -----      From:Galileah Hessie Dibble      Sent:05/18/2019  4:42 PM EDT        [id number] Alayiah Fontes P   Subject:RE:Results  Hi Jess! Yes, I saw that. :( Bummer - I was hoping to have more answers after the scan. I wanted to tell you that the muscle relaxer has actually been helping! It does not eradicate the discomfort altogether but definitely helps - don't know if that gives you any indication of what it could or couldn't be.Marland KitchenMarland KitchenOtherwise, I still feel the same but definitely want to figure out what the heck is going on. I will also say I missed 2 days of the Protonix by accident and definitely began to feel more of the reflux-y symptoms so I resumed the medicine yesterday and those symptoms lessened. With all of that said, I'm down for whatever you feel is best!  Thank you, Audryna   ----- Message -----      From:Nurse Annalisa Colonna P      Sent:05/18/2019  3:35 PM EDT        [id number] M Myrtie Soman   Subject:Results  Your  HIDA scan was completely normal.  That being said, we discussed next steps of evaluation as EGD to rule out ulcer disease, etc. versus a CT scan. How have you been  feeling?  Let me know how you would like to proceed.  EGD or CT scan.   Thank you,   Jess

## 2019-06-08 NOTE — Telephone Encounter (Signed)
I am glad that you are feeling better.  I honestly felt like it would be low yield to really show [place] much, but would be the next step from a GI standpoint.  If you are really feeling better then I would say that we could cancel for now, but you could even wait until closer to your appts to make that decision since you are still even a week away from your nurse pre-visit (and procedure could still even be cancelled after that if you wanted to wait longer).    Thank you,  Jess

## 2019-06-09 ENCOUNTER — Encounter: Payer: 59 | Admitting: Gastroenterology

## 2019-06-16 ENCOUNTER — Encounter: Payer: Self-pay | Admitting: Gastroenterology

## 2019-06-16 ENCOUNTER — Ambulatory Visit (AMBULATORY_SURGERY_CENTER): Payer: 59 | Admitting: *Deleted

## 2019-06-16 ENCOUNTER — Other Ambulatory Visit: Payer: Self-pay

## 2019-06-16 VITALS — Ht 68.0 in | Wt 132.0 lb

## 2019-06-16 DIAGNOSIS — Z1159 Encounter for screening for other viral diseases: Secondary | ICD-10-CM

## 2019-06-16 DIAGNOSIS — R1011 Right upper quadrant pain: Secondary | ICD-10-CM

## 2019-06-16 NOTE — Progress Notes (Addendum)
Patient's pre-visit was done today over the phone with the patient due to COVID-19 pandemic. Name,DOB and address verified. Insurance verified. Packet of Prep instructions mailed to patient including copy of a consent form and pre-procedure patient acknowledgement form-pt is aware. Patient understands to call us back with any questions or concerns.  Pt is aware that care partner will wait in the car during procedure; if they feel like they will be too hot or cold to wait in the car; they may wait in the 4 th floor lobby. Patient is aware to bring only one care partner. We want them to wear a mask (we do not have any that we can provide them), practice social distancing, and we will check their temperatures when they get here.  I did remind the patient that their care partner needs to stay in the parking lot the entire time and have a cell phone available, we will call them when the pt is ready for discharge. Patient will wear mask into building.  Patient's Covid screening test on 06/27/2019-pt is aware.

## 2019-06-19 ENCOUNTER — Ambulatory Visit (INDEPENDENT_AMBULATORY_CARE_PROVIDER_SITE_OTHER): Payer: 59 | Admitting: Family Medicine

## 2019-06-19 ENCOUNTER — Encounter: Payer: Self-pay | Admitting: Family Medicine

## 2019-06-19 ENCOUNTER — Other Ambulatory Visit: Payer: Self-pay

## 2019-06-19 VITALS — BP 116/76 | HR 76 | Temp 98.4°F | Ht 68.0 in | Wt 136.0 lb

## 2019-06-19 DIAGNOSIS — Z0000 Encounter for general adult medical examination without abnormal findings: Secondary | ICD-10-CM | POA: Diagnosis not present

## 2019-06-19 DIAGNOSIS — Z114 Encounter for screening for human immunodeficiency virus [HIV]: Secondary | ICD-10-CM | POA: Diagnosis not present

## 2019-06-19 DIAGNOSIS — Z23 Encounter for immunization: Secondary | ICD-10-CM

## 2019-06-19 LAB — LIPID PANEL
Cholesterol: 168 mg/dL (ref 0–200)
HDL: 72.2 mg/dL (ref >39.00)
LDL Cholesterol: 86 mg/dL (ref 0–99)
NonHDL: 95.78
Total CHOL/HDL Ratio: 2
Triglycerides: 47 mg/dL (ref 0.0–149.0)
VLDL: 9.4 mg/dL (ref 0.0–40.0)

## 2019-06-19 NOTE — Patient Instructions (Signed)
Preventive Care 21-32 Years Old, Female Preventive care refers to visits with your health care provider and lifestyle choices that can promote health and wellness. This includes:  A yearly physical exam. This may also be called an annual well check.  Regular dental visits and eye exams.  Immunizations.  Screening for certain conditions.  Healthy lifestyle choices, such as eating a healthy diet, getting regular exercise, not using drugs or products that contain nicotine and tobacco, and limiting alcohol use. What can I expect for my preventive care visit? Physical exam Your health care provider will check your:  Height and weight. This may be used to calculate body mass index (BMI), which tells if you are at a healthy weight.  Heart rate and blood pressure.  Skin for abnormal spots. Counseling Your health care provider may ask you questions about your:  Alcohol, tobacco, and drug use.  Emotional well-being.  Home and relationship well-being.  Sexual activity.  Eating habits.  Work and work environment.  Method of birth control.  Menstrual cycle.  Pregnancy history. What immunizations do I need?  Influenza (flu) vaccine  This is recommended every year. Tetanus, diphtheria, and pertussis (Tdap) vaccine  You may need a Td booster every 10 years. Varicella (chickenpox) vaccine  You may need this if you have not been vaccinated. Human papillomavirus (HPV) vaccine  If recommended by your health care provider, you may need three doses over 6 months. Measles, mumps, and rubella (MMR) vaccine  You may need at least one dose of MMR. You may also need a second dose. Meningococcal conjugate (MenACWY) vaccine  One dose is recommended if you are age 19-21 years and a first-year college student living in a residence hall, or if you have one of several medical conditions. You may also need additional booster doses. Pneumococcal conjugate (PCV13) vaccine  You may need  this if you have certain conditions and were not previously vaccinated. Pneumococcal polysaccharide (PPSV23) vaccine  You may need one or two doses if you smoke cigarettes or if you have certain conditions. Hepatitis A vaccine  You may need this if you have certain conditions or if you travel or work in places where you may be exposed to hepatitis A. Hepatitis B vaccine  You may need this if you have certain conditions or if you travel or work in places where you may be exposed to hepatitis B. Haemophilus influenzae type b (Hib) vaccine  You may need this if you have certain conditions. You may receive vaccines as individual doses or as more than one vaccine together in one shot (combination vaccines). Talk with your health care provider about the risks and benefits of combination vaccines. What tests do I need?  Blood tests  Lipid and cholesterol levels. These may be checked every 5 years starting at age 20.  Hepatitis C test.  Hepatitis B test. Screening  Diabetes screening. This is done by checking your blood sugar (glucose) after you have not eaten for a while (fasting).  Sexually transmitted disease (STD) testing.  BRCA-related cancer screening. This may be done if you have a family history of breast, ovarian, tubal, or peritoneal cancers.  Pelvic exam and Pap test. This may be done every 3 years starting at age 21. Starting at age 30, this may be done every 5 years if you have a Pap test in combination with an HPV test. Talk with your health care provider about your test results, treatment options, and if necessary, the need for more tests.   Follow these instructions at home: Eating and drinking   Eat a diet that includes fresh fruits and vegetables, whole grains, lean protein, and low-fat dairy.  Take vitamin and mineral supplements as recommended by your health care provider.  Do not drink alcohol if: ? Your health care provider tells you not to drink. ? You are  pregnant, may be pregnant, or are planning to become pregnant.  If you drink alcohol: ? Limit how much you have to 0-1 drink a day. ? Be aware of how much alcohol is in your drink. In the [place] one drink equals one 12 oz bottle of beer (355 mL), one 5 oz glass of wine (148 mL), or one 1 oz glass of hard liquor (44 mL). Lifestyle  Take daily care of your teeth and gums.  Stay active. Exercise for at least 30 minutes on 5 or more days each week.  Do not use any products that contain nicotine or tobacco, such as cigarettes, e-cigarettes, and chewing tobacco. If you need help quitting, ask your health care provider.  If you are sexually active, practice safe sex. Use a condom or other form of birth control (contraception) in order to prevent pregnancy and STIs (sexually transmitted infections). If you plan to become pregnant, see your health care provider for a preconception visit. What's next?  Visit your health care provider once a year for a well check visit.  Ask your health care provider how often you should have your eyes and teeth checked.  Stay up to date on all vaccines. This information is not intended to replace advice given to you by your health care provider. Make sure you discuss any questions you have with your health care provider. Document Released: 10/13/2001 Document Revised: 04/28/2018 Document Reviewed: 04/28/2018 Elsevier Patient Education  2020 Elsevier Inc.  

## 2019-06-19 NOTE — Progress Notes (Signed)
Patient: Audrey Reyes MRN: [id number] DOB: 1985-09-27 PCP: Orma Flaming, MD     Subjective:  Chief Complaint  Patient presents with  . Annual Exam    HPI: The patient is a 33 y.o. female who presents today for annual exam. She denies any changes to past medical history. There have been no recent hospitalizations. They are following a well balanced diet and exercise plan. Weight has been stable. No complaints today. Has an EGD scheduled with GI later on this month. She is doing better and thinks she is going to cancel this. She is fasting today.   No first degree family relative with colon/breast cancer.    Immunization History  Administered Date(s) Administered  . Influenza,inj,Quad PF,6+ Mos 06/21/2017, 06/23/2018  . Tdap 06/21/2017   Colonoscopy: routine screening  Mammogram: routine screening  Pap smear: 2018. Dr. Franne Forts in [place]. Need records. She requested.  Flu shot: today    Review of Systems  Constitutional: Negative for chills, fatigue and fever.  HENT: Negative for dental problem, ear pain, hearing loss and trouble swallowing.   Eyes: Negative for visual disturbance.  Respiratory: Negative for cough, chest tightness and shortness of breath.   Cardiovascular: Negative for chest pain, palpitations and leg swelling.  Gastrointestinal: Negative for abdominal pain, blood in stool, diarrhea, nausea and vomiting.  Endocrine: Negative for cold intolerance, polydipsia, polyphagia and polyuria.  Genitourinary: Negative for dysuria and hematuria.  Musculoskeletal: Negative for arthralgias.  Skin: Negative for rash.  Neurological: Negative for dizziness and headaches.  Psychiatric/Behavioral: Negative for dysphoric mood and sleep disturbance. The patient is not nervous/anxious.     Allergies Patient is allergic to sulfa antibiotics.  Past Medical History Patient  has a past medical history of Allergies, Asthma, History of chicken pox, Urticaria, and UTI  (urinary tract infection).  Surgical History Patient  has a past surgical history that includes Tonsillectomy.  Family History Pateint's family history includes Alcohol abuse in her maternal grandfather and paternal grandfather; Cancer in her maternal grandfather; Colon polyps in her father; Diabetes in her father, paternal grandfather, and paternal grandmother; Hearing loss in her maternal grandfather; Heart attack in her paternal grandfather and paternal grandmother; Hyperlipidemia in her father, paternal grandfather, and paternal grandmother; Hypertension in her father, paternal grandfather, and paternal grandmother; Other in her mother; Stroke in her maternal grandfather, paternal grandfather, and paternal grandmother.  Social History Patient  reports that she has never smoked. She has never used smokeless tobacco. She reports current alcohol use. She reports previous drug use.    Objective: Vitals:   06/19/19 0837  BP: 116/76  Pulse: 76  Temp: 98.4 F (36.9 C)  TempSrc: Skin  SpO2: 99%  Weight: 136 lb (61.7 kg)  Height: 5\' 8"  (1.727 m)    Body mass index is 20.68 kg/m.  Physical Exam Vitals signs reviewed.  Constitutional:      Appearance: Normal appearance. She is well-developed and normal weight.  HENT:     Head: Normocephalic and atraumatic.     Right Ear: Tympanic membrane, ear canal and external ear normal.     Left Ear: Tympanic membrane, ear canal and external ear normal.     Nose: Nose normal.     Mouth/Throat:     Mouth: Mucous membranes are moist.  Eyes:     Extraocular Movements: Extraocular movements intact.     Conjunctiva/sclera: Conjunctivae normal.     Pupils: Pupils are equal, round, and reactive to light.  Neck:  Musculoskeletal: Normal range of motion and neck supple.     Thyroid: No thyromegaly.  Cardiovascular:     Rate and Rhythm: Normal rate and regular rhythm.     Pulses: Normal pulses.     Heart sounds: Normal heart sounds. No murmur.   Pulmonary:     Effort: Pulmonary effort is normal.     Breath sounds: Normal breath sounds.  Abdominal:     General: Abdomen is flat. Bowel sounds are normal. There is no distension.     Palpations: Abdomen is soft.     Tenderness: There is no abdominal tenderness.  Lymphadenopathy:     Cervical: No cervical adenopathy.  Skin:    General: Skin is warm and dry.     Capillary Refill: Capillary refill takes less than 2 seconds.     Findings: No rash.  Neurological:     General: No focal deficit present.     Mental Status: She is alert and oriented to person, place, and time.     Cranial Nerves: No cranial nerve deficit.     Coordination: Coordination normal.     Deep Tendon Reflexes: Reflexes normal.  Psychiatric:        Mood and Affect: Mood normal.        Behavior: Behavior normal.        Depression screen [facility] 2/9 06/19/2019 05/03/2019  Decreased Interest 0 0  Down, Depressed, Hopeless 0 0  PHQ - 2 Score 0 0     Assessment/plan: 1. Annual physical exam Routine fasting labs today. Has had everything done but her cholesterol/hiv. She has requested her records from previous pcp already. Flu shot today, otherwise utd on HM. Continue healthy lifestyle. Recommended exercise. F/u in one year or as needed.  - Lipid panel  2. Encounter for screening for HIV  - HIV Antibody (routine testing w rflx)     Return in about 1 year (around 06/18/2020).     Orma Flaming, MD [place]  06/19/2019

## 2019-06-20 LAB — HIV ANTIBODY (ROUTINE TESTING W REFLEX): HIV 1&2 Ab, 4th Generation: NONREACTIVE

## 2019-06-30 ENCOUNTER — Encounter: Payer: 59 | Admitting: Gastroenterology

## 2019-12-03 ENCOUNTER — Other Ambulatory Visit: Payer: Self-pay

## 2019-12-03 ENCOUNTER — Ambulatory Visit (HOSPITAL_COMMUNITY): Payer: BC Managed Care – PPO

## 2019-12-03 ENCOUNTER — Ambulatory Visit (INDEPENDENT_AMBULATORY_CARE_PROVIDER_SITE_OTHER): Payer: BC Managed Care – PPO

## 2019-12-03 ENCOUNTER — Ambulatory Visit (HOSPITAL_COMMUNITY)
Admission: EM | Admit: 2019-12-03 | Discharge: 2019-12-03 | Disposition: A | Discharge status: Home / Self Care | Payer: BC Managed Care – PPO | Attending: Emergency Medicine | Admitting: Emergency Medicine

## 2019-12-03 ENCOUNTER — Encounter (HOSPITAL_COMMUNITY): Payer: Self-pay

## 2019-12-03 DIAGNOSIS — S93601A Unspecified sprain of right foot, initial encounter: Secondary | ICD-10-CM

## 2019-12-03 DIAGNOSIS — S99921A Unspecified injury of right foot, initial encounter: Secondary | ICD-10-CM | POA: Diagnosis not present

## 2019-12-03 DIAGNOSIS — M79671 Pain in right foot: Secondary | ICD-10-CM | POA: Diagnosis not present

## 2019-12-03 NOTE — ED Provider Notes (Signed)
[facility]    CSN: [id number] Arrival date & time: 12/03/19  1459      History   Chief Complaint Chief Complaint  Patient presents with  . Ankle Pain    HPI Audrey Reyes is a 34 y.o. female.   Audrey Reyes presents with complaints of right lateral foot pain after she twisted it, mis-stepped off a step, a few hours ago today. Immediate pain but was able to bear weight. Since then bearing weight has become more painful. She has applied ice and elevated the foot. Denies any previous injury to the ankle or foot. Hasn't taken any medications for her symptoms. She heard a pop sound with the injury. No numbness or tingling.    ROS per HPI, negative if not otherwise mentioned.      Past Medical History:  Diagnosis Date  . Allergies   . Asthma   . History of chicken pox   . Urticaria    chronic hives per pt  . UTI (urinary tract infection)     Patient Active Problem List   Diagnosis Date Noted  . Pyrosis 05/12/2019  . Urticaria 05/03/2019  . 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 TONSILLECTOMY      OB History   No obstetric history on file.      Home Medications    Prior to Admission medications   Medication Sig Start Date End Date Taking? Authorizing Provider  albuterol (VENTOLIN HFA) 108 (90 Base) MCG/ACT inhaler Ventolin HFA 90 mcg/actuation aerosol inhaler    [provider]  cetirizine (ZYRTEC) 10 MG tablet Take 10 mg by mouth 2 (two) times daily. 04/25/19   [provider]    Family History Family History  Problem Relation Age of Onset  . Other Mother        Abn pap HPV  . Diabetes Father   . Hyperlipidemia Father   . Hypertension Father   . Colon polyps Father   . Alcohol abuse Maternal Grandfather   . Cancer Maternal Grandfather   . Hearing loss Maternal Grandfather   . Stroke Maternal Grandfather   . Diabetes  Paternal Grandmother   . Heart attack Paternal Grandmother   . Hyperlipidemia Paternal Grandmother   . Hypertension Paternal Grandmother   . Stroke Paternal Grandmother   . Alcohol abuse Paternal Grandfather   . Diabetes Paternal Grandfather   . Heart attack Paternal Grandfather   . Hyperlipidemia Paternal Grandfather   . Hypertension Paternal Grandfather   . Stroke Paternal Grandfather   . Colon cancer Neg Hx   . Stomach cancer Neg Hx   . Pancreatic cancer Neg Hx   . Esophageal cancer Neg Hx     Social History Social History   Tobacco Use  . Smoking status: Never Smoker  . Smokeless tobacco: Never Used  Substance Use Topics  . Alcohol use: Yes    Comment: 1 glass wine/beer per day  . Drug use: Not Currently     Allergies   Sulfa antibiotics   Review of Systems Review of Systems   Physical Exam Triage Vital Signs ED Triage Vitals  Enc Vitals Group     BP 12/03/19 1536 (!) 148/78     Pulse Rate 12/03/19 1536 82     Resp 12/03/19 1536 18     Temp 12/03/19 1536 98.3 F (36.8 C)     Temp Source  12/03/19 1536 Oral     SpO2 12/03/19 1536 100 %     Weight --      Height --      Head Circumference --      Peak Flow --      Pain Score 12/03/19 1535 9     Pain Loc --      Pain Edu? --      Excl. in [place]? --    No data found.  Updated Vital Signs BP (!) 148/78 (BP Location: Right Arm)   Pulse 82   Temp 98.3 F (36.8 C) (Oral)   Resp 18   SpO2 100%   Visual Acuity Right Eye Distance:   Left Eye Distance:   Bilateral Distance:    Right Eye Near:   Left Eye Near:    Bilateral Near:     Physical Exam Constitutional:      General: She is not in acute distress.    Appearance: She is well-developed.  Cardiovascular:     Rate and Rhythm: Normal rate.  Pulmonary:     Effort: Pulmonary effort is normal.  Musculoskeletal:     Right ankle: Normal.     Right foot: Normal range of motion. Swelling, tenderness and bony tenderness present. No deformity or  laceration.     Comments: Tenderness and swelling noted to mid foot overlying the proximal 4th and 5th metatarsals and mid foot; no ankle pain or swelling and full ROM noted; cap refill < 2 seconds ; strong pedal pulse  Skin:    General: Skin is warm and dry.  Neurological:     Mental Status: She is alert and oriented to person, place, and time.      UC Treatments / Results  Labs (all labs ordered are listed, but only abnormal results are displayed) Labs Reviewed - No data to display  EKG   Radiology DG Foot Complete Right  Result Date: 12/03/2019 CLINICAL DATA:  Pain after rolling injury EXAM: RIGHT FOOT COMPLETE - 3+ VIEW COMPARISON:  None. FINDINGS: Frontal, oblique, and lateral views were obtained. There is no fracture or dislocation. Joint spaces appear normal. No erosive change. IMPRESSION: No fracture or dislocation.  No evident arthropathy. Electronically Signed   By: Lowella Grip III M.D.   On: 12/03/2019 15:52    Procedures Procedures (including critical care time)  Medications Ordered in UC Medications - No data to display  Initial Impression / Assessment and Plan / UC Course  I have reviewed the triage vital signs and the nursing notes.  Pertinent labs & imaging results that were available during my care of the patient were reviewed by me and considered in my medical decision making (see chart for details).     Foot sprain. No abnormal findings on xray. Ace wrap placed. Pain management discussed. Follow up recommendations provided as well. Patient verbalized understanding and agreeable to plan.  Ambulatory out of clinic.  Final Clinical Impressions(s) / UC Diagnoses   Final diagnoses:  Sprain of right foot, initial encounter     Discharge Instructions     Ice, elevation, antiinflammatories for pain- take with food.  Ace wrap for compression and support.  Activity as tolerated.  I have provided some ankle sprain rehab exercises as able once pain has  improved.  Follow up with sports medicine and/or orthopedics as needed if persistent concerns after approximately 4-6 weeks.    ED Prescriptions    None     PDMP not reviewed this encounter.  Zigmund Gottron, NP 12/03/19 1635

## 2019-12-03 NOTE — Discharge Instructions (Addendum)
Ice, elevation, antiinflammatories for pain- take with food.  Ace wrap for compression and support.  Activity as tolerated.  I have provided some ankle sprain rehab exercises as able once pain has improved.  Follow up with sports medicine and/or orthopedics as needed if persistent concerns after approximately 4-6 weeks.

## 2019-12-03 NOTE — ED Triage Notes (Signed)
Pt present she was walking down some steps and missed a step that she did not see and fall. She twisted her right ankle and heard a pop.

## 2019-12-07 ENCOUNTER — Ambulatory Visit: Payer: BC Managed Care – PPO | Attending: Internal Medicine

## 2019-12-07 ENCOUNTER — Other Ambulatory Visit: Payer: Self-pay

## 2019-12-07 DIAGNOSIS — Z20822 Contact with and (suspected) exposure to covid-19: Secondary | ICD-10-CM

## 2019-12-08 ENCOUNTER — Telehealth (INDEPENDENT_AMBULATORY_CARE_PROVIDER_SITE_OTHER): Payer: BC Managed Care – PPO | Admitting: Physician Assistant

## 2019-12-08 ENCOUNTER — Encounter: Payer: Self-pay | Admitting: Physician Assistant

## 2019-12-08 DIAGNOSIS — L509 Urticaria, unspecified: Secondary | ICD-10-CM | POA: Diagnosis not present

## 2019-12-08 LAB — SARS-COV-2, NAA 2 DAY TAT

## 2019-12-08 LAB — NOVEL CORONAVIRUS, NAA: SARS-CoV-2, NAA: NOT DETECTED

## 2019-12-08 MED ORDER — PREDNISONE 10 MG PO TABS: ORAL_TABLET | ORAL | 0 refills | Status: AC

## 2019-12-08 NOTE — Progress Notes (Signed)
Virtual Visit via Video   I connected with Audrey Reyes on 12/08/19 at 11:30 AM EDT by a video enabled telemedicine application and verified that I am speaking with the correct person using two identifiers. Location patient: Home Location provider: [facility], Office Persons participating in the virtual visit: Pierra M Tomico Gannaway, Pamela Maddy PA-C.  I discussed the limitations of evaluation and management by telemedicine and the availability of in person appointments. The patient expressed understanding and agreed to proceed.   Subjective:   HPI:   Hives Patient has history of urticaria. Has seen allergist in the past. 3 days ago she had a recent flare-up of her symptoms -- developed hives along her bra line, on her trunk/neck, bilateral legs. Area is very itching. She takes zyrtec daily and recently added benadryl to help with her breakthrough itching -- but this is sedating for her.  Denies: airway swelling, lip/tongue swelling  No LMP recorded.    ROS: See pertinent positives and negatives per HPI.  Patient Active Problem List   Diagnosis Date Noted  . Pyrosis 05/12/2019  . Urticaria 05/03/2019  . 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 Smoking status: Never Smoker  . Smokeless tobacco: Never Used  Substance Use Topics  . Alcohol use: Yes    Comment: 1 glass wine/beer per day    Current Outpatient Medications:  .  albuterol (VENTOLIN HFA) 108 (90 Base) MCG/ACT inhaler, Ventolin HFA 90 mcg/actuation aerosol inhaler, Disp: , Rfl:  .  cetirizine (ZYRTEC) 10 MG tablet, Take 10 mg by mouth 2 (two) times daily., Disp: , Rfl:  .  predniSONE (DELTASONE) 10 MG tablet, Take two tablets daily x 1 week, then one tablet daily x 1 week, Disp: 21 tablet, Rfl: 0  Allergies  Allergen Reactions  . Sulfa Antibiotics Hives and Rash    Objective:   VITALS: Per patient if applicable, see  vitals. GENERAL: Alert, appears well and in no acute distress. HEENT: Atraumatic, conjunctiva clear, no obvious abnormalities on inspection of external nose and ears. NECK: Normal movements of the head and neck. CARDIOPULMONARY: No increased WOB. Speaking in clear sentences. I:E ratio WNL.  MS: Moves all visible extremities without noticeable abnormality. PSYCH: Pleasant and cooperative, well-groomed. Speech normal rate and rhythm. Affect is appropriate. Insight and judgement are appropriate. Attention is focused, linear, and appropriate.  NEURO: CN grossly intact. Oriented as arrived to appointment on time with no prompting. Moves both UE equally.  SKIN: well-demarcated erythematous lesions scattered on torso/groin  Assessment and Plan:   Shaylyn was seen today for urticaria.  Diagnoses and all orders for this visit:  Urticaria  Other orders -     predniSONE (DELTASONE) 10 MG tablet; Take two tablets daily x 1 week, then one tablet daily x 1 week   No red flags on discussion. She has rx for epi-pen. Will trial round of oral prednisone to help improve her symptoms. Worsening precautions advised.  . Reviewed expectations re: course of current medical issues. . Discussed self-management of symptoms. . Outlined signs and symptoms indicating need for more acute intervention. . Patient verbalized understanding and all questions were answered. Marland Kitchen Health Maintenance issues including appropriate healthy diet, exercise, and smoking avoidance were discussed with patient. . See orders for this visit as documented in the electronic medical record.  I discussed the assessment and treatment plan with the patient. The patient was provided an  opportunity to ask questions and all were answered. The patient agreed with the plan and demonstrated an understanding of the instructions.   The patient was advised to call back or seek an in-person evaluation if the symptoms worsen or if the condition fails to  improve as anticipated.   CMA or LPN served as scribe during this visit. History, Physical, and Plan performed by medical provider. The above documentation has been reviewed and is accurate and complete.  Inda Coke, PA-C   12/08/2019

## 2020-01-10 ENCOUNTER — Ambulatory Visit: Payer: BC Managed Care – PPO | Attending: Internal Medicine

## 2020-01-10 ENCOUNTER — Other Ambulatory Visit: Payer: Self-pay

## 2020-01-10 DIAGNOSIS — Z20822 Contact with and (suspected) exposure to covid-19: Secondary | ICD-10-CM

## 2020-01-11 LAB — NOVEL CORONAVIRUS, NAA: SARS-CoV-2, NAA: NOT DETECTED

## 2020-01-11 LAB — SARS-COV-2, NAA 2 DAY TAT

## 2020-04-22 DIAGNOSIS — R52 Pain, unspecified: Secondary | ICD-10-CM | POA: Diagnosis not present

## 2020-04-22 DIAGNOSIS — J029 Acute pharyngitis, unspecified: Secondary | ICD-10-CM | POA: Diagnosis not present

## 2020-04-22 DIAGNOSIS — R5381 Other malaise: Secondary | ICD-10-CM | POA: Diagnosis not present

## 2020-05-17 DIAGNOSIS — M9902 Segmental and somatic dysfunction of thoracic region: Secondary | ICD-10-CM | POA: Diagnosis not present

## 2020-05-17 DIAGNOSIS — M9904 Segmental and somatic dysfunction of sacral region: Secondary | ICD-10-CM | POA: Diagnosis not present

## 2020-05-17 DIAGNOSIS — M9903 Segmental and somatic dysfunction of lumbar region: Secondary | ICD-10-CM | POA: Diagnosis not present

## 2020-05-17 DIAGNOSIS — S29012A Strain of muscle and tendon of back wall of thorax, initial encounter: Secondary | ICD-10-CM | POA: Diagnosis not present

## 2020-06-24 ENCOUNTER — Encounter: Payer: BC Managed Care – PPO | Admitting: Family Medicine

## 2020-07-19 DIAGNOSIS — R52 Pain, unspecified: Secondary | ICD-10-CM | POA: Diagnosis not present

## 2020-07-19 DIAGNOSIS — J029 Acute pharyngitis, unspecified: Secondary | ICD-10-CM | POA: Diagnosis not present

## 2020-07-19 DIAGNOSIS — R0981 Nasal congestion: Secondary | ICD-10-CM | POA: Diagnosis not present

## 2020-07-19 DIAGNOSIS — R5383 Other fatigue: Secondary | ICD-10-CM | POA: Diagnosis not present

## 2020-08-01 DIAGNOSIS — Z23 Encounter for immunization: Secondary | ICD-10-CM | POA: Diagnosis not present

## 2020-08-16 DIAGNOSIS — Z23 Encounter for immunization: Secondary | ICD-10-CM | POA: Diagnosis not present

## 2020-09-07 DIAGNOSIS — R300 Dysuria: Secondary | ICD-10-CM | POA: Diagnosis not present

## 2020-09-07 DIAGNOSIS — R8290 Unspecified abnormal findings in urine: Secondary | ICD-10-CM | POA: Diagnosis not present

## 2020-09-19 DIAGNOSIS — Z1151 Encounter for screening for human papillomavirus (HPV): Secondary | ICD-10-CM | POA: Diagnosis not present

## 2020-09-19 DIAGNOSIS — Z13220 Encounter for screening for lipoid disorders: Secondary | ICD-10-CM | POA: Diagnosis not present

## 2020-09-19 DIAGNOSIS — R6882 Decreased libido: Secondary | ICD-10-CM | POA: Diagnosis not present

## 2020-09-19 DIAGNOSIS — Z3169 Encounter for other general counseling and advice on procreation: Secondary | ICD-10-CM | POA: Diagnosis not present

## 2020-09-19 DIAGNOSIS — Z124 Encounter for screening for malignant neoplasm of cervix: Secondary | ICD-10-CM | POA: Diagnosis not present

## 2020-09-19 DIAGNOSIS — Z0000 Encounter for general adult medical examination without abnormal findings: Secondary | ICD-10-CM | POA: Diagnosis not present

## 2020-09-19 DIAGNOSIS — Z01419 Encounter for gynecological examination (general) (routine) without abnormal findings: Secondary | ICD-10-CM | POA: Diagnosis not present

## 2020-09-21 DIAGNOSIS — J069 Acute upper respiratory infection, unspecified: Secondary | ICD-10-CM | POA: Diagnosis not present

## 2020-09-21 DIAGNOSIS — Z20822 Contact with and (suspected) exposure to covid-19: Secondary | ICD-10-CM | POA: Diagnosis not present

## 2020-09-25 DIAGNOSIS — L508 Other urticaria: Secondary | ICD-10-CM | POA: Diagnosis not present

## 2020-09-25 DIAGNOSIS — D485 Neoplasm of uncertain behavior of skin: Secondary | ICD-10-CM | POA: Diagnosis not present

## 2020-09-25 DIAGNOSIS — L700 Acne vulgaris: Secondary | ICD-10-CM | POA: Diagnosis not present

## 2020-09-30 DIAGNOSIS — Z0000 Encounter for general adult medical examination without abnormal findings: Secondary | ICD-10-CM | POA: Diagnosis not present

## 2020-09-30 DIAGNOSIS — Z13220 Encounter for screening for lipoid disorders: Secondary | ICD-10-CM | POA: Diagnosis not present

## 2020-09-30 DIAGNOSIS — Z3169 Encounter for other general counseling and advice on procreation: Secondary | ICD-10-CM | POA: Diagnosis not present

## 2020-10-06 IMAGING — DX DG FOOT COMPLETE 3+V*R*
3 series · 3 of 3 positions shown · non-contrast
Comparison: None.

CLINICAL DATA: Pain after rolling injury

EXAM:
RIGHT FOOT COMPLETE - 3+ VIEW

[foot ap]
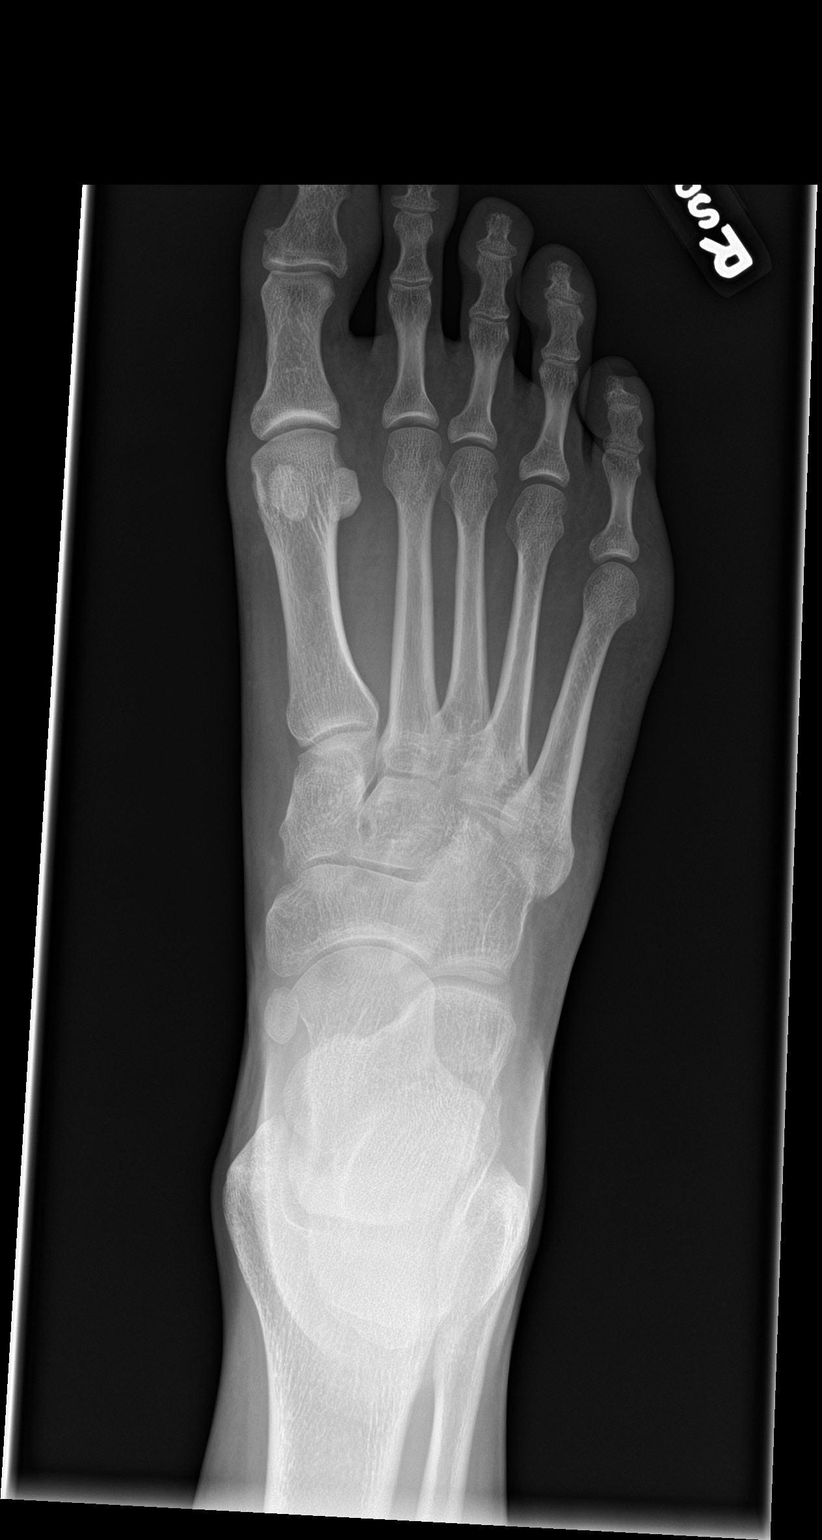

[foot obl]
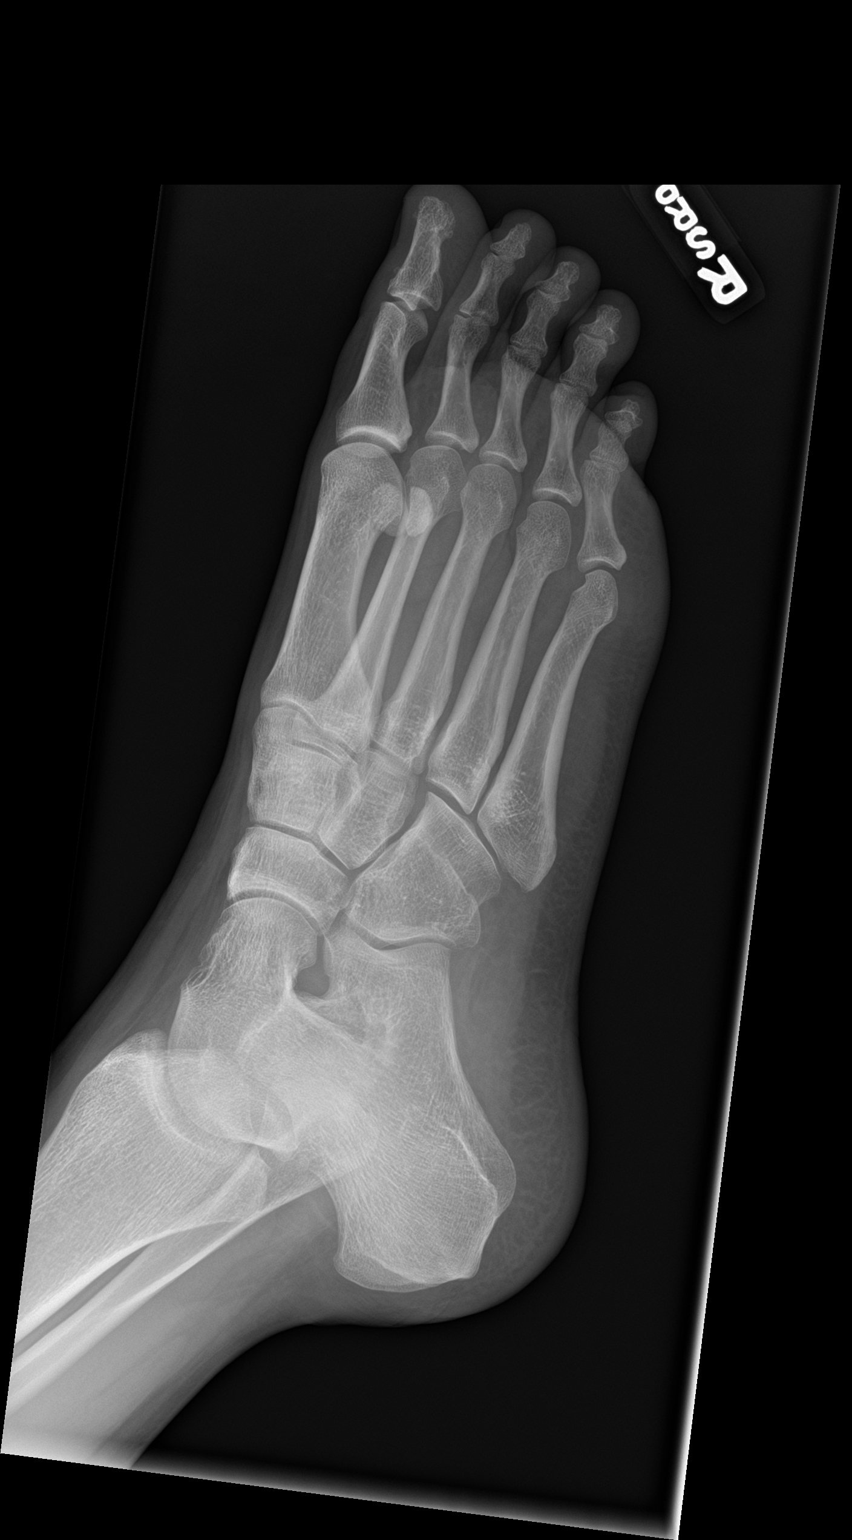

[foot lat]
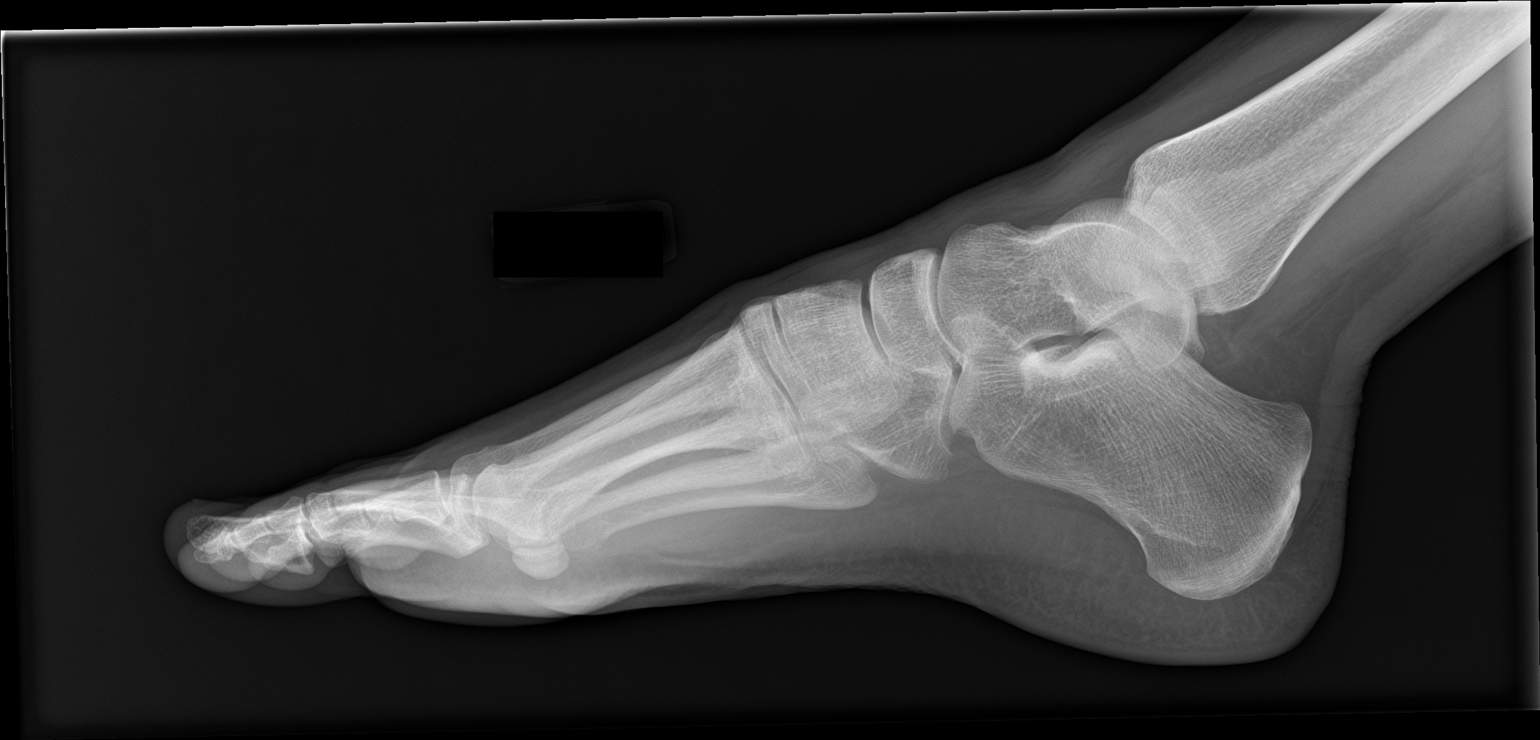

[3 of 3 positions shown; findings below may reference images not displayed]

FINDINGS: Frontal, oblique, and lateral views were obtained. There is no
fracture or dislocation. Joint spaces appear normal. No erosive
change.
IMPRESSION: No fracture or dislocation.  No evident arthropathy.

## 2020-11-27 DIAGNOSIS — R399 Unspecified symptoms and signs involving the genitourinary system: Secondary | ICD-10-CM | POA: Diagnosis not present

## 2020-12-23 DIAGNOSIS — U071 COVID-19: Secondary | ICD-10-CM | POA: Diagnosis not present

## 2020-12-23 DIAGNOSIS — J029 Acute pharyngitis, unspecified: Secondary | ICD-10-CM | POA: Diagnosis not present

## 2020-12-23 DIAGNOSIS — Z20822 Contact with and (suspected) exposure to covid-19: Secondary | ICD-10-CM | POA: Diagnosis not present

## 2021-01-10 DIAGNOSIS — N912 Amenorrhea, unspecified: Secondary | ICD-10-CM | POA: Diagnosis not present

## 2021-01-10 DIAGNOSIS — Z3481 Encounter for supervision of other normal pregnancy, first trimester: Secondary | ICD-10-CM | POA: Diagnosis not present

## 2021-02-12 DIAGNOSIS — Z3A12 12 weeks gestation of pregnancy: Secondary | ICD-10-CM | POA: Diagnosis not present

## 2021-02-12 DIAGNOSIS — O368390 Maternal care for abnormalities of the fetal heart rate or rhythm, unspecified trimester, not applicable or unspecified: Secondary | ICD-10-CM | POA: Diagnosis not present

## 2021-02-12 DIAGNOSIS — Z3689 Encounter for other specified antenatal screening: Secondary | ICD-10-CM | POA: Diagnosis not present

## 2021-02-12 DIAGNOSIS — O368310 Maternal care for abnormalities of the fetal heart rate or rhythm, first trimester, not applicable or unspecified: Secondary | ICD-10-CM | POA: Diagnosis not present

## 2021-02-12 DIAGNOSIS — Z3482 Encounter for supervision of other normal pregnancy, second trimester: Secondary | ICD-10-CM | POA: Diagnosis not present

## 2021-02-12 DIAGNOSIS — Z3683 Encounter for fetal screening for congenital cardiac abnormalities: Secondary | ICD-10-CM | POA: Diagnosis not present
